# Patient Record
Sex: Female | Born: 1935 | Race: White | Hispanic: No | Marital: Married | Smoking: Never smoker
Health system: Southern US, Community
[De-identification: ages and names within clinical notes are randomized; demographics above are authoritative.]

## PROBLEM LIST (undated history)

## (undated) DIAGNOSIS — I480 Paroxysmal atrial fibrillation: Secondary | ICD-10-CM

## (undated) DIAGNOSIS — I10 Essential (primary) hypertension: Secondary | ICD-10-CM

## (undated) DIAGNOSIS — K2100 Gastro-esophageal reflux disease with esophagitis, without bleeding: Secondary | ICD-10-CM

## (undated) HISTORY — PX: APPENDECTOMY: SHX54

## (undated) HISTORY — PX: CHOLECYSTECTOMY: SHX55

---

## 2016-04-30 ENCOUNTER — Emergency Department: Payer: Medicare Other

## 2016-04-30 ENCOUNTER — Observation Stay (HOSPITAL_BASED_OUTPATIENT_CLINIC_OR_DEPARTMENT_OTHER)
Admit: 2016-04-30 | Discharge: 2016-04-30 | Disposition: A | Discharge status: Home / Self Care | Payer: Medicare Other | Attending: Internal Medicine | Admitting: Internal Medicine

## 2016-04-30 ENCOUNTER — Observation Stay
Admission: EM | Admit: 2016-04-30 | Discharge: 2016-05-01 | Disposition: A | Discharge status: Home / Self Care | Payer: Medicare Other | Attending: Internal Medicine | Admitting: Internal Medicine

## 2016-04-30 ENCOUNTER — Encounter: Payer: Self-pay | Admitting: Radiology

## 2016-04-30 ENCOUNTER — Observation Stay: Payer: Medicare Other

## 2016-04-30 DIAGNOSIS — E039 Hypothyroidism, unspecified: Secondary | ICD-10-CM

## 2016-04-30 DIAGNOSIS — I4891 Unspecified atrial fibrillation: Secondary | ICD-10-CM

## 2016-04-30 DIAGNOSIS — M549 Dorsalgia, unspecified: Secondary | ICD-10-CM

## 2016-04-30 DIAGNOSIS — I350 Nonrheumatic aortic (valve) stenosis: Secondary | ICD-10-CM | POA: Insufficient documentation

## 2016-04-30 DIAGNOSIS — E876 Hypokalemia: Secondary | ICD-10-CM

## 2016-04-30 DIAGNOSIS — Z79899 Other long term (current) drug therapy: Secondary | ICD-10-CM | POA: Insufficient documentation

## 2016-04-30 DIAGNOSIS — Z7982 Long term (current) use of aspirin: Secondary | ICD-10-CM | POA: Insufficient documentation

## 2016-04-30 DIAGNOSIS — R001 Bradycardia, unspecified: Secondary | ICD-10-CM | POA: Diagnosis not present

## 2016-04-30 DIAGNOSIS — M47814 Spondylosis without myelopathy or radiculopathy, thoracic region: Secondary | ICD-10-CM | POA: Insufficient documentation

## 2016-04-30 DIAGNOSIS — R778 Other specified abnormalities of plasma proteins: Secondary | ICD-10-CM | POA: Insufficient documentation

## 2016-04-30 DIAGNOSIS — I10 Essential (primary) hypertension: Secondary | ICD-10-CM

## 2016-04-30 DIAGNOSIS — M546 Pain in thoracic spine: Secondary | ICD-10-CM | POA: Diagnosis not present

## 2016-04-30 DIAGNOSIS — R079 Chest pain, unspecified: Secondary | ICD-10-CM

## 2016-04-30 DIAGNOSIS — I480 Paroxysmal atrial fibrillation: Secondary | ICD-10-CM | POA: Insufficient documentation

## 2016-04-30 DIAGNOSIS — K210 Gastro-esophageal reflux disease with esophagitis: Secondary | ICD-10-CM | POA: Diagnosis not present

## 2016-04-30 DIAGNOSIS — R7989 Other specified abnormal findings of blood chemistry: Secondary | ICD-10-CM | POA: Diagnosis not present

## 2016-04-30 DIAGNOSIS — R0789 Other chest pain: Principal | ICD-10-CM | POA: Insufficient documentation

## 2016-04-30 DIAGNOSIS — K219 Gastro-esophageal reflux disease without esophagitis: Secondary | ICD-10-CM

## 2016-04-30 DIAGNOSIS — D72829 Elevated white blood cell count, unspecified: Secondary | ICD-10-CM | POA: Insufficient documentation

## 2016-04-30 DIAGNOSIS — Z9049 Acquired absence of other specified parts of digestive tract: Secondary | ICD-10-CM | POA: Insufficient documentation

## 2016-04-30 DIAGNOSIS — R748 Abnormal levels of other serum enzymes: Secondary | ICD-10-CM | POA: Diagnosis not present

## 2016-04-30 DIAGNOSIS — G8929 Other chronic pain: Secondary | ICD-10-CM | POA: Diagnosis not present

## 2016-04-30 HISTORY — DX: Essential (primary) hypertension: I10

## 2016-04-30 HISTORY — DX: Gastro-esophageal reflux disease with esophagitis, without bleeding: K21.00

## 2016-04-30 HISTORY — DX: Gastro-esophageal reflux disease with esophagitis: K21.0

## 2016-04-30 HISTORY — DX: Paroxysmal atrial fibrillation: I48.0

## 2016-04-30 LAB — BASIC METABOLIC PANEL
ANION GAP: 11 (ref 5–15)
ANION GAP: 7 (ref 5–15)
BUN: 24 mg/dL — ABNORMAL HIGH (ref 6–20)
BUN: 24 mg/dL — ABNORMAL HIGH (ref 6–20)
CALCIUM: 9.3 mg/dL (ref 8.9–10.3)
CHLORIDE: 106 mmol/L (ref 101–111)
CO2: 24 mmol/L (ref 22–32)
CO2: 27 mmol/L (ref 22–32)
Calcium: 8.9 mg/dL (ref 8.9–10.3)
Chloride: 107 mmol/L (ref 101–111)
Creatinine, Ser: 0.68 mg/dL (ref 0.44–1.00)
Creatinine, Ser: 0.77 mg/dL (ref 0.44–1.00)
GFR calc non Af Amer: >60 mL/min (ref >60)
Glucose, Bld: 166 mg/dL — ABNORMAL HIGH (ref 65–99)
Glucose, Bld: 117 mg/dL — ABNORMAL HIGH (ref 65–99)
POTASSIUM: 3 mmol/L — AB (ref 3.5–5.1)
Potassium: 5 mmol/L (ref 3.5–5.1)
SODIUM: 142 mmol/L (ref 135–145)
Sodium: 140 mmol/L (ref 135–145)

## 2016-04-30 LAB — CBC
HEMATOCRIT: 37.1 % (ref 35.0–47.0)
HEMATOCRIT: 35.8 % (ref 35.0–47.0)
HEMOGLOBIN: 12.7 g/dL (ref 12.0–16.0)
HEMOGLOBIN: 12 g/dL (ref 12.0–16.0)
MCH: 29.9 pg (ref 26.0–34.0)
MCH: 29.8 pg (ref 26.0–34.0)
MCHC: 34.1 g/dL (ref 32.0–36.0)
MCHC: 33.6 g/dL (ref 32.0–36.0)
MCV: 87.7 fL (ref 80.0–100.0)
MCV: 88.7 fL (ref 80.0–100.0)
Platelets: 290 10*3/uL (ref 150–440)
Platelets: 274 10*3/uL (ref 150–440)
RBC: 4.23 MIL/uL (ref 3.80–5.20)
RBC: 4.03 MIL/uL (ref 3.80–5.20)
RDW: 13.7 % (ref 11.5–14.5)
RDW: 14 % (ref 11.5–14.5)
WBC: 11.2 10*3/uL — AB (ref 3.6–11.0)
WBC: 13.3 10*3/uL — AB (ref 3.6–11.0)

## 2016-04-30 LAB — TROPONIN I
Troponin I: 0.12 ng/mL (ref <0.03)
Troponin I: 0.41 ng/mL (ref <0.03)
Troponin I: 0.38 ng/mL (ref <0.03)

## 2016-04-30 LAB — MAGNESIUM: Magnesium: 2 mg/dL (ref 1.7–2.4)

## 2016-04-30 LAB — TSH: TSH: 1.451 u[IU]/mL (ref 0.350–4.500)

## 2016-04-30 MED ORDER — DOCUSATE SODIUM 100 MG PO CAPS
100.0000 mg | ORAL_CAPSULE | Freq: Two times a day (BID) | ORAL | Status: DC
  Administered 2016-04-30: 100 mg via ORAL
  Filled 2016-04-30 (×2): qty 1

## 2016-04-30 MED ORDER — LEVOTHYROXINE SODIUM 88 MCG PO TABS
88.0000 ug | ORAL_TABLET | Freq: Every day | ORAL | Status: DC
  Administered 2016-04-30 – 2016-05-01 (×2): 88 ug via ORAL
  Filled 2016-04-30 (×2): qty 1

## 2016-04-30 MED ORDER — POTASSIUM CHLORIDE 20 MEQ PO PACK
40.0000 meq | PACK | Freq: Once | ORAL | Status: AC
  Administered 2016-04-30: 40 meq via ORAL
  Filled 2016-04-30: qty 2

## 2016-04-30 MED ORDER — SODIUM CHLORIDE 0.9% FLUSH
3.0000 mL | Freq: Two times a day (BID) | INTRAVENOUS | Status: DC
  Administered 2016-04-30 – 2016-05-01 (×3): 3 mL via INTRAVENOUS

## 2016-04-30 MED ORDER — ONDANSETRON HCL 4 MG/2ML IJ SOLN
4.0000 mg | Freq: Once | INTRAMUSCULAR | Status: AC
  Administered 2016-04-30: 4 mg via INTRAVENOUS
  Filled 2016-04-30: qty 2

## 2016-04-30 MED ORDER — AMLODIPINE BESYLATE 10 MG PO TABS
10.0000 mg | ORAL_TABLET | Freq: Every day | ORAL | Status: DC
  Administered 2016-04-30 – 2016-05-01 (×2): 10 mg via ORAL
  Filled 2016-04-30 (×2): qty 1

## 2016-04-30 MED ORDER — ASPIRIN 81 MG PO CHEW
81.0000 mg | CHEWABLE_TABLET | Freq: Every day | ORAL | Status: DC
  Administered 2016-04-30 – 2016-05-01 (×2): 81 mg via ORAL
  Filled 2016-04-30 (×2): qty 1

## 2016-04-30 MED ORDER — ACETAMINOPHEN 325 MG PO TABS: 650.0000 mg | ORAL_TABLET | Freq: Four times a day (QID) | ORAL | Status: DC | PRN

## 2016-04-30 MED ORDER — ENOXAPARIN SODIUM 40 MG/0.4ML ~~LOC~~ SOLN
40.0000 mg | SUBCUTANEOUS | Status: DC
  Administered 2016-04-30 – 2016-05-01 (×2): 40 mg via SUBCUTANEOUS
  Filled 2016-04-30 (×2): qty 0.4

## 2016-04-30 MED ORDER — IOPAMIDOL (ISOVUE-370) INJECTION 76%
100.0000 mL | Freq: Once | INTRAVENOUS | Status: AC | PRN
  Administered 2016-04-30: 100 mL via INTRAVENOUS

## 2016-04-30 MED ORDER — HYDROCHLOROTHIAZIDE 25 MG PO TABS
25.0000 mg | ORAL_TABLET | Freq: Every day | ORAL | Status: DC
  Administered 2016-04-30 – 2016-05-01 (×2): 25 mg via ORAL
  Filled 2016-04-30 (×2): qty 1

## 2016-04-30 MED ORDER — NITROGLYCERIN 0.4 MG SL SUBL: 0.4000 mg | SUBLINGUAL_TABLET | SUBLINGUAL | Status: DC | PRN

## 2016-04-30 MED ORDER — LISINOPRIL-HYDROCHLOROTHIAZIDE 20-25 MG PO TABS: 1.0000 | ORAL_TABLET | Freq: Every day | ORAL | Status: DC

## 2016-04-30 MED ORDER — PANTOPRAZOLE SODIUM 40 MG PO TBEC
40.0000 mg | DELAYED_RELEASE_TABLET | Freq: Every day | ORAL | Status: DC
Start: 2016-04-30 — End: 2016-05-01
  Administered 2016-04-30 – 2016-05-01 (×2): 40 mg via ORAL
  Filled 2016-04-30 (×2): qty 1

## 2016-04-30 MED ORDER — ONDANSETRON HCL 4 MG/2ML IJ SOLN: 4.0000 mg | Freq: Four times a day (QID) | INTRAMUSCULAR | Status: DC | PRN

## 2016-04-30 MED ORDER — LISINOPRIL 20 MG PO TABS
20.0000 mg | ORAL_TABLET | Freq: Every day | ORAL | Status: DC
  Administered 2016-04-30 – 2016-05-01 (×2): 20 mg via ORAL
  Filled 2016-04-30 (×2): qty 1

## 2016-04-30 MED ORDER — ONDANSETRON HCL 4 MG PO TABS: 4.0000 mg | ORAL_TABLET | Freq: Four times a day (QID) | ORAL | Status: DC | PRN

## 2016-04-30 MED ORDER — ACETAMINOPHEN 650 MG RE SUPP: 650.0000 mg | Freq: Four times a day (QID) | RECTAL | Status: DC | PRN

## 2016-04-30 NOTE — Progress Notes (Signed)
*  PRELIMINARY RESULTS* Echocardiogram 2D Echocardiogram has been performed.  Cristela BlueHege, Neil Errickson 04/30/2016, 9:12 AM

## 2016-04-30 NOTE — Progress Notes (Signed)
Elevated troponins,seen by cardiologist,schedule for stress test tomorrow,remains bradycardic

## 2016-04-30 NOTE — ED Provider Notes (Signed)
[facility] Emergency Department Provider Note    First MD Initiated Contact with Patient 04/30/16 0250     (approximate)  I have reviewed the triage vital signs and the nursing notes.   HISTORY  Chief Complaint Chest Pain    HPI Christine James is a 83 y.o. female with history of hypertension presents to the emergency department via EMS with chest and back "pressure"with onset on awakening this morning. Patient does admit to episodes of dyspnea yesterday. Per EMS patient was diaphoretic on their arrival with a heart rate in the 40s. Patient denies any lower external pain swelling. Patient denies any history of DVT or PE. Patient denies any nausea or vomiting. Patient states current pain score is 9 out of 10 predominately in the patient's back. Patient was given aspirin by EMS.   Past Medical History:  Diagnosis Date  . Hypertension     There are no active problems to display for this patient.   No past surgical history on file.  Prior to Admission medications   Not on File    Allergies Codeine and Sulfa antibiotics  No family history on file.  Social History Social History  Substance Use Topics  . Smoking status: Not on file  . Smokeless tobacco: Not on file  . Alcohol use Not on file    Review of Systems Constitutional: No fever/chills Eyes: No visual changes. ENT: No sore throat. Cardiovascular: Positive for chest pain. Respiratory: Denies shortness of breath. Gastrointestinal: No abdominal pain.  No nausea, no vomiting.  No diarrhea.  No constipation. Genitourinary: Negative for dysuria. Musculoskeletal: Positive for upper back pain. Skin: Negative for rash. Neurological: Negative for headaches, focal weakness or numbness.  10-point ROS otherwise negative.  ____________________________________________   PHYSICAL EXAM:  VITAL SIGNS: ED Triage Vitals  Enc Vitals Group     BP 04/30/16 0253 (!) 144/110     Pulse Rate  04/30/16 0253 (!) 47     Resp 04/30/16 0253 12     Temp --      Temp src --      SpO2 04/30/16 0253 96 %     Weight 04/30/16 0255 142 lb (64.4 kg)     Height 04/30/16 0255 5\' 5"  (1.651 m)     Head Circumference --      Peak Flow --      Pain Score 04/30/16 0258 9     Pain Loc --      Pain Edu? --      Excl. in GC? --     Constitutional: Alert and oriented. Apparent discomfort Eyes: Conjunctivae are normal. PERRL. EOMI. Head: Atraumatic. Mouth/Throat: Mucous membranes are moist.  Oropharynx non-erythematous. Neck: No stridor.  No meningeal signs.  No cervical spine tenderness to palpation. Cardiovascular: Normal rate, regular rhythm. Good peripheral circulation. Grossly normal heart sounds. Respiratory: Normal respiratory effort.  No retractions. Lungs CTAB. Gastrointestinal: Soft and nontender. No distention.  Musculoskeletal: No lower extremity tenderness nor edema. No gross deformities of extremities. Neurologic:  Normal speech and language. No gross focal neurologic deficits are appreciated.  Skin:  Skin is warm, dry and intact. No rash noted. Psychiatric: Mood and affect are normal. Speech and behavior are normal.  ____________________________________________   LABS (all labs ordered are listed, but only abnormal results are displayed)  Labs Reviewed  BASIC METABOLIC PANEL - Abnormal; Notable for the following:       Result Value   Potassium 3.0 (*)    Glucose, Bld  166 (*)    BUN 24 (*)    All other components within normal limits  CBC - Abnormal; Notable for the following:    WBC 11.2 (*)    All other components within normal limits  TROPONIN I   ____________________________________________  EKG  ED ECG REPORT I, Wardner N Tavie Haseman, the attending physician, personally viewed and interpreted this ECG.   Date: 04/30/2016  EKG Time: 2:53 AM  Rate: 48  Rhythm: Sinus bradycardia  Axis: Normal  Intervals: Normal  ST&T Change: ST segment depression 1 aVL, V3 and  V4 V5 and V6  ____________________________________________  RADIOLOGY I, Trion N Naaman Curro, personally viewed and evaluated these images (plain radiographs) as part of my medical decision making, as well as reviewing the written report by the radiologist.  Dg Chest Port 1 View  Result Date: 04/30/2016 CLINICAL DATA:  83 year old female with chest pain. EXAM: PORTABLE CHEST 1 VIEW COMPARISON:  None. FINDINGS: There is mild emphysematous changes of the lungs with chronic interstitial coarsening. No focal consolidation, pleural effusion, or pneumothorax. No evidence of vascular congestion or edema. The cardiac silhouette is within normal limits. There is atherosclerotic calcification of the aortic arch. There is osteopenia. No acute fracture. IMPRESSION: No active disease. Electronically Signed   By: Elgie CollardArash  Radparvar M.D.   On: 04/30/2016 03:20    ____________________________________________  Procedures   _   INITIAL IMPRESSION / ASSESSMENT AND PLAN / ED COURSE  Pertinent labs & imaging results that were available during my care of the patient were reviewed by me and considered in my medical decision making (see chart for details).  83 year old female presenting with chest and back pressure social with diaphoresis and sinus bradycardia noted on EKG. Laboratory data revealed a normal troponin. Given history and physical exam concern for possible cardiac etiology for patient's chest/back pain. Patient discussed with Dr. Sheryle Haildiamond for hospital admission for further evaluation for chest/back pain as well as sinus bradycardia.      ____________________________________________  FINAL CLINICAL IMPRESSION(S) / ED DIAGNOSES  Final diagnoses:  Chest pain, unspecified type  Sinus bradycardia     MEDICATIONS GIVEN DURING THIS VISIT:  Medications  iopamidol (ISOVUE-370) 76 % injection 100 mL (100 mLs Intravenous Contrast Given 04/30/16 0353)     NEW OUTPATIENT MEDICATIONS STARTED DURING  THIS VISIT:  New Prescriptions   No medications on file    Modified Medications   No medications on file    Discontinued Medications   No medications on file     Note:  This document was prepared using Dragon voice recognition software and may include unintentional dictation errors.    Darci Currentandolph N Vivianna Piccini, MD 04/30/16 [phone]

## 2016-04-30 NOTE — H&P (Signed)
Christine James is an 83 y.o. female.   Chief Complaint: Chest pain HPI: The patient with past medical history of hypertension and GERD presents to emergency department complaining of chest pain. More specifically, the patient admits to pain in her upper back that seems to radiate through her chest (although notably, she states that she does not have anterior chest pain). She describes the pain as "a Mack truck rolling over her chest". He began at rest and lasted for hours. She denies diaphoresis but admits to shortness of breath and nausea. She denies wanting. This is not the first time she has had shortness of breath but in the past attributed to deconditioning whether it be walking up steps or carrying something heavy. CTA of the chest was obtained to rule out aortic dissection. Initial troponin was negative but due to the patient's severe symptoms emergency department staff called the hospitalist service for admission.  Past Medical History:  Diagnosis Date  . Hypertension     Past Surgical History:  Procedure Laterality Date  . APPENDECTOMY    . CHOLECYSTECTOMY      Family History  Problem Relation Age of Onset  . Heart disease Father    Social History:  has no tobacco, alcohol, and drug history on file.  Allergies:  Allergies  Allergen Reactions  . Codeine Shortness Of Breath  . Sulfa Antibiotics Shortness Of Breath    Prior to Admission medications   Medication Sig Start Date End Date Taking? Authorizing Provider  amLODipine (NORVASC) 10 MG tablet Take 10 mg by mouth daily.   Yes Historical Provider, MD  aspirin 81 MG chewable tablet Chew 81 mg by mouth daily.   Yes Historical Provider, MD  levothyroxine (SYNTHROID, LEVOTHROID) 88 MCG tablet Take 88 mcg by mouth daily before breakfast.   Yes Historical Provider, MD  lisinopril-hydrochlorothiazide (PRINZIDE,ZESTORETIC) 20-25 MG tablet Take 1 tablet by mouth daily.   Yes Historical Provider, MD     Results for orders placed  or performed during the hospital encounter of 04/30/16 (from the past 48 hour(s))  Basic metabolic panel     Status: Abnormal   Collection Time: 04/30/16  2:58 AM  Result Value Ref Range   Sodium 142 135 - 145 mmol/L   Potassium 3.0 (L) 3.5 - 5.1 mmol/L   Chloride 107 101 - 111 mmol/L   CO2 24 22 - 32 mmol/L   Glucose, Bld 166 (H) 65 - 99 mg/dL   BUN 24 (H) 6 - 20 mg/dL   Creatinine, Ser 0.68 0.44 - 1.00 mg/dL   Calcium 8.9 8.9 - 10.3 mg/dL   GFR calc non Af Amer >60 >60 mL/min   GFR calc Af Amer >60 >60 mL/min    Comment: (NOTE) The eGFR has been calculated using the CKD EPI equation. This calculation has not been validated in all clinical situations. eGFR's persistently <60 mL/min signify possible Chronic Kidney Disease.    Anion gap 11 5 - 15  CBC     Status: Abnormal   Collection Time: 04/30/16  2:58 AM  Result Value Ref Range   WBC 11.2 (H) 3.6 - 11.0 K/uL   RBC 4.23 3.80 - 5.20 MIL/uL   Hemoglobin 12.7 12.0 - 16.0 g/dL   HCT 37.1 35.0 - 47.0 %   MCV 87.7 80.0 - 100.0 fL   MCH 29.9 26.0 - 34.0 pg   MCHC 34.1 32.0 - 36.0 g/dL   RDW 13.7 11.5 - 14.5 %   Platelets 290 150 - 440  K/uL  Troponin I     Status: None   Collection Time: 04/30/16  2:58 AM  Result Value Ref Range   Troponin I <0.03 <0.03 ng/mL   Dg Chest Port 1 View  Result Date: 04/30/2016 CLINICAL DATA:  83 year old female with chest pain. EXAM: PORTABLE CHEST 1 VIEW COMPARISON:  None. FINDINGS: There is mild emphysematous changes of the lungs with chronic interstitial coarsening. No focal consolidation, pleural effusion, or pneumothorax. No evidence of vascular congestion or edema. The cardiac silhouette is within normal limits. There is atherosclerotic calcification of the aortic arch. There is osteopenia. No acute fracture. IMPRESSION: No active disease. Electronically Signed   By: Anner Crete M.D.   On: 04/30/2016 03:20   Ct Angio Chest Aorta W And/or Wo Contrast  Result Date: 04/30/2016 CLINICAL DATA:   83 year old female with dental chest pain radiating to the back. EXAM: CT ANGIOGRAPHY CHEST WITH CONTRAST TECHNIQUE: Multidetector CT imaging of the chest was performed using the standard protocol during bolus administration of intravenous contrast. Multiplanar CT image reconstructions and MIPs were obtained to evaluate the vascular anatomy. CONTRAST:  100 cc Isovue 370 COMPARISON:  Chest radiograph dated 04/30/2016 FINDINGS: Cardiovascular: There is mild cardiomegaly with biatrial dilatation. Retrograde flow of contrast from the right atrium into the IVC consistent with a degree of right cardiac dysfunction. Correlation with echocardiogram recommended. There is no pericardial effusion. Moderate atherosclerotic calcification of the thoracic aorta. No aneurysmal dilatation or evidence of dissection. The origins of the great vessels of the aortic arch appear patent. There is no CT evidence of pulmonary embolism. Mediastinum/Nodes: Bilateral hilar and mediastinal lymph adenopathy noted. Right hilar lymph node measures up to 16 mm in short axis. Subcarinal lymphadenopathy measures 16 mm short axis. Right paratracheal lymph nodes measures 19 mm in short axis. There is a small hiatal hernia. The esophagus is grossly unremarkable. Lungs/Pleura: Faint small scattered nodular densities in the left upper lobe measuring up to 3-4 mm may be chronic or likely inflammatory/infectious in etiology. A confluent small nodular densities noted along the right major fissure (series 7, image 25), likely inflammatory/ infectious. An area of linear and streaky density in the lingula may represent atelectasis versus scarring. Pneumonia is not excluded. There is no pleural effusion or pneumothorax. Upper Abdomen: The visualized upper abdomen is unremarkable. Musculoskeletal: Mild degenerative changes of the spine. No acute fracture. Review of the MIP images confirms the above findings. IMPRESSION: 1. No CT evidence of pulmonary embolism. 2.  Mild cardiomegaly with findings suggestive of right cardiac dysfunction. Correlation with echocardiogram recommended. 3. Scattered small nodular densities in the left upper lobe and along the right major fissure which may be chronic or represent an infectious/inflammatory process. Focal linear and streaky density in the lingula anteriorly likely atelectasis/scarring. Clinical correlation follow-up recommended. 4. Bilateral hilar and mediastinal adenopathy of indeterminate etiology. Although findings may be reactive, metastatic disease or lymphoma is not entirely excluded. Correlation with clinical exam and follow-up recommended. Electronically Signed   By: Anner Crete M.D.   On: 04/30/2016 04:22    Review of Systems  Constitutional: Negative for chills and fever.  HENT: Negative for sore throat and tinnitus.   Eyes: Negative for blurred vision and redness.  Respiratory: Positive for shortness of breath (acute on chronic). Negative for cough.   Cardiovascular: Positive for chest pain (posterior). Negative for palpitations, orthopnea and PND.  Gastrointestinal: Positive for nausea. Negative for abdominal pain, diarrhea and vomiting.  Genitourinary: Negative for dysuria, frequency and urgency.  Musculoskeletal:  Negative for joint pain and myalgias.  Skin: Negative for rash.       No lesions  Neurological: Negative for speech change, focal weakness and weakness.  Endo/Heme/Allergies: Does not bruise/bleed easily.       No temperature intolerance  Psychiatric/Behavioral: Negative for depression and suicidal ideas.    Blood pressure (!) 125/51, pulse (!) 54, resp. rate 19, height 5' 5"  (1.651 m), weight 64.4 kg (142 lb), SpO2 96 %. Physical Exam  Vitals reviewed. Constitutional: She is oriented to person, place, and time. She appears well-developed and well-nourished. No distress.  HENT:  Head: Normocephalic and atraumatic.  Mouth/Throat: Oropharynx is clear and moist.  Eyes: Conjunctivae  and EOM are normal. Pupils are equal, round, and reactive to light. No scleral icterus.  Neck: Normal range of motion. Neck supple. No JVD present. No tracheal deviation present. No thyromegaly present.  Cardiovascular: Normal rate, regular rhythm and normal heart sounds.  Exam reveals no gallop and no friction rub.   No murmur heard. Respiratory: Effort normal and breath sounds normal.  GI: Soft. Bowel sounds are normal. She exhibits no distension. There is no tenderness.  Genitourinary:  Genitourinary Comments: Deferred  Musculoskeletal: Normal range of motion. She exhibits edema (trace).  Lymphadenopathy:    She has no cervical adenopathy.  Neurological: She is alert and oriented to person, place, and time. No cranial nerve deficit. She exhibits normal muscle tone.  Skin: Skin is warm and dry. No rash noted. No erythema.  Psychiatric: She has a normal mood and affect. Her behavior is normal. Judgment and thought content normal.     Assessment/Plan This is an 83 year old female admitted for atypical chest pain. 1. Chest pain: Atypical in duration and location. The patient reports history of severe reflux. Her symptoms could be consistent with the same. No dissection seen on CT scan. The patient does have some chronic persistent changes in her lungs including a potentially nodular area in the right lower lung. There is also some fluid in the right fissure. White blood cell count is barely elevated above lab normals although her presentation is not consistent with pneumonia or pulmonary edema. Continue cycle cardiac enzymes. Monitor telemetry. Echocardiogram ordered. Consult cardiology. Continue aspirin 2. Hypertension: Controlled; continue amlodipine, lisinopril and hydrochlorothiazide 3. Hypothyroidism: Continue Synthroid. Check TSH. 4. DVT prophylaxis: Lovenox 5. GI prophylaxis: PPI for reflux symptoms The patient is a full code. Time spent on admission orders and patient care approximately  45 minutes  Harrie Foreman, MD 04/30/2016, 6:47 AM

## 2016-04-30 NOTE — Progress Notes (Signed)
Notified of elevated troponin 0.41. Has remained symptom-free. Presenting symptoms atypical (back pressure that is reproducible to palpation). If she remains symptom-free can hold off on heparin gtt at this time. Echo shows LVH with AS. Elevated troponin possibly in the setting of Afib with LVH and AS. [facility]

## 2016-04-30 NOTE — Progress Notes (Signed)
[employer] [facility] [place]   PATIENT NAME: Christine James    MR#:  [id number]  DATE OF BIRTH:  [birth year]  SUBJECTIVE:   Pt. Here due to back pain/atypical chest pain.  Pain free now.  No other complaints presently. Son at bedside.   REVIEW OF SYSTEMS:    Review of Systems  Constitutional: Negative for chills and fever.  HENT: Negative for congestion and tinnitus.   Eyes: Negative for blurred vision and double vision.  Respiratory: Negative for cough, shortness of breath and wheezing.   Cardiovascular: Negative for chest pain, orthopnea and PND.  Gastrointestinal: Negative for abdominal pain, diarrhea, nausea and vomiting.  Genitourinary: Negative for dysuria and hematuria.  Neurological: Negative for dizziness, sensory change and focal weakness.  All other systems reviewed and are negative.   Nutrition: Heart Healthy Tolerating Diet: Yes Tolerating PT: Ambulatory    DRUG ALLERGIES:   Allergies  Allergen Reactions  . Codeine Shortness Of Breath  . Sulfa Antibiotics Shortness Of Breath    VITALS:  Blood pressure (!) 128/40, pulse (!) 57, temperature 98.2 F (36.8 C), temperature source Oral, resp. rate 20, height 5\' 2"  (1.575 m), weight 63.7 kg (140 lb 6.4 oz), SpO2 93 %.  PHYSICAL EXAMINATION:   Physical Exam  GENERAL:  83 y.o.-year-old patient lying in the bed with no acute distress.  EYES: Pupils equal, round, reactive to light and accommodation. No scleral icterus. Extraocular muscles intact.  HEENT: Head atraumatic, normocephalic. Oropharynx and nasopharynx clear.  NECK:  Supple, no jugular venous distention. No thyroid enlargement, no tenderness.  LUNGS: Normal breath sounds bilaterally, no wheezing, rales, rhonchi. No use of accessory muscles of respiration.  CARDIOVASCULAR: S1, S2 normal.  II/VI SEM at LSB, No rubs, or gallops.  ABDOMEN: Soft, nontender, nondistended. Bowel sounds present. No organomegaly or mass.  EXTREMITIES: No  cyanosis, clubbing or edema b/l.    NEUROLOGIC: Cranial nerves II through XII are intact. No focal Motor or sensory deficits b/l.   PSYCHIATRIC: The patient is alert and oriented x 3.  SKIN: No obvious rash, lesion, or ulcer.    LABORATORY PANEL:   CBC  Recent Labs Lab 04/30/16 0743  WBC 13.3*  HGB 12.0  HCT 35.8  PLT 274   ------------------------------------------------------------------------------------------------------------------  Chemistries   Recent Labs Lab 04/30/16 0743 04/30/16 1257  NA 140  --   K 5.0  --   CL 106  --   CO2 27  --   GLUCOSE 117*  --   BUN 24*  --   CREATININE 0.77  --   CALCIUM 9.3  --   MG  --  2.0   ------------------------------------------------------------------------------------------------------------------  Cardiac Enzymes  Recent Labs Lab 04/30/16 1257  TROPONINI 0.41*   ------------------------------------------------------------------------------------------------------------------  RADIOLOGY:  Dg Thoracic Spine 2 View  Result Date: 04/30/2016 CLINICAL DATA:  Back pain since early this morning. No acute injury. EXAM: THORACIC SPINE 2 VIEWS COMPARISON:  CT scan of the chest dated 04/30/2016 FINDINGS: There is slight degenerative disc disease at T7-8 with a vacuum phenomenon anteriorly. There is no fracture or subluxation. Incidental note is made of aortic atherosclerosis. IMPRESSION: Slight degenerative disc disease at T7-8. Aortic atherosclerosis. Electronically Signed   By: Francene Boyers M.D.   On: 04/30/2016 13:34   Dg Chest Port 1 View  Result Date: 04/30/2016 CLINICAL DATA:  83 year old female with chest pain. EXAM: PORTABLE CHEST 1 VIEW COMPARISON:  None. FINDINGS: There is mild emphysematous changes of the lungs with chronic interstitial  coarsening. No focal consolidation, pleural effusion, or pneumothorax. No evidence of vascular congestion or edema. The cardiac silhouette is within normal limits. There is  atherosclerotic calcification of the aortic arch. There is osteopenia. No acute fracture. IMPRESSION: No active disease. Electronically Signed   By: Elgie Collard M.D.   On: 04/30/2016 03:20   Ct Angio Chest Aorta W And/or Wo Contrast  Result Date: 04/30/2016 CLINICAL DATA:  83 year old female with dental chest pain radiating to the back. EXAM: CT ANGIOGRAPHY CHEST WITH CONTRAST TECHNIQUE: Multidetector CT imaging of the chest was performed using the standard protocol during bolus administration of intravenous contrast. Multiplanar CT image reconstructions and MIPs were obtained to evaluate the vascular anatomy. CONTRAST:  100 cc Isovue 370 COMPARISON:  Chest radiograph dated 04/30/2016 FINDINGS: Cardiovascular: There is mild cardiomegaly with biatrial dilatation. Retrograde flow of contrast from the right atrium into the IVC consistent with a degree of right cardiac dysfunction. Correlation with echocardiogram recommended. There is no pericardial effusion. Moderate atherosclerotic calcification of the thoracic aorta. No aneurysmal dilatation or evidence of dissection. The origins of the great vessels of the aortic arch appear patent. There is no CT evidence of pulmonary embolism. Mediastinum/Nodes: Bilateral hilar and mediastinal lymph adenopathy noted. Right hilar lymph node measures up to 16 mm in short axis. Subcarinal lymphadenopathy measures 16 mm short axis. Right paratracheal lymph nodes measures 19 mm in short axis. There is a small hiatal hernia. The esophagus is grossly unremarkable. Lungs/Pleura: Faint small scattered nodular densities in the left upper lobe measuring up to 3-4 mm may be chronic or likely inflammatory/infectious in etiology. A confluent small nodular densities noted along the right major fissure (series 7, image 25), likely inflammatory/ infectious. An area of linear and streaky density in the lingula may represent atelectasis versus scarring. Pneumonia is not excluded. There is  no pleural effusion or pneumothorax. Upper Abdomen: The visualized upper abdomen is unremarkable. Musculoskeletal: Mild degenerative changes of the spine. No acute fracture. Review of the MIP images confirms the above findings. IMPRESSION: 1. No CT evidence of pulmonary embolism. 2. Mild cardiomegaly with findings suggestive of right cardiac dysfunction. Correlation with echocardiogram recommended. 3. Scattered small nodular densities in the left upper lobe and along the right major fissure which may be chronic or represent an infectious/inflammatory process. Focal linear and streaky density in the lingula anteriorly likely atelectasis/scarring. Clinical correlation follow-up recommended. 4. Bilateral hilar and mediastinal adenopathy of indeterminate etiology. Although findings may be reactive, metastatic disease or lymphoma is not entirely excluded. Correlation with clinical exam and follow-up recommended. Electronically Signed   By: Elgie Collard M.D.   On: 04/30/2016 04:22     ASSESSMENT AND PLAN:   83 year old female with past medical history of hypertension, as well as atrial fibrillation, GERD who presented to the hospital due to back pain/atypical chest pain.  1. Atypical chest pain-patient currently is chest pain-free and hemodynamically stable.  -her troponins are mildly trending upwards. -Plan for stress test tomorrow.  - clinically stable. CT angio was (-) for Aortic dissection.   2. Back Pain - due to DJD.  - Xray of thoracic spine showing DJD but no acute pathology.   3. Essential hypertension-continue lisinopril/HCTZ, Norvasc.  4. Paroxysmal atrial fibrillation-currently in normal sinus rhythm and rate controlled. -As per cards she may benefit from long-term coagulation. Plan to start Eliquis upon discharge.    All the records are reviewed and case discussed with Care Management/Social Worker. Management plans discussed with the patient, family and they  are in  agreement.  CODE STATUS: Full Code  DVT Prophylaxis: Lovenox  TOTAL TIME TAKING CARE OF THIS PATIENT: 30 minutes.   POSSIBLE D/C IN 1-2 DAYS, DEPENDING ON CLINICAL CONDITION.   [place] SirenSAINANI,Hussam Muniz J M.D on 04/30/2016 at 4:09 PM  Between 7am to 6pm - Pager - [phone]  After 6pm go to [email or web address] - Scientist, research (life sciences)password [email or web address]  [facility]  Office  [phone]  CC: Primary care physician; No PCP Per Patient

## 2016-04-30 NOTE — Consult Note (Signed)
Cardiology Consultation Note  Patient ID: Christine James, MRN: [id number], DOB/AGE: Jan 22, 1929 83 y.o. Admit date: 04/30/2016   Date of Consult: 04/30/2016 Primary Physician: No PCP Per Patient Primary Cardiologist: New to [facility] - consult by [place] Requesting Physician: Dr. Sheryle Hail, MD  Chief Complaint: Back pain Reason for Consult: Same/noted to be in new onset Afib upon cardiology review of telemetry   HPI: 83 y.o. female with h/o HTN and reflux who presented to [facility] overnight with thoracic back lasting approximately 2-3 hours that self resolved. Cardiology is consulted for back pain. Upon cardiology seeing the patient she was noted to have had a short run of new onset Afib, rate controlled.   She does not have any previously known cardiac history. Patient notes approximately 25 pounds weight loss over the past 12 months, not intentional. She has a long history of reflux that is usually worse at night. She was in her usual state of health until the middle of the night on 3/14 when she developed thoracic back pain prompting her to come to the ED for evaluation. Never with chest pain, SOB, palpitations, nausea, vomiting, dizziness, presyncope, or syncope. No recent injury or trauma. Pain felt similar to her prior reflux, though persisted longer. She denies any decrease in functional capacity and has never had any exertional symptoms. She sleeps with one pillow. No LE swelling. No orthopnea.   Upon the patient's arrival to [facility] they were found to have blood pressure 144/110, HR 47 bpm, oxygen saturation 96% on room air. Initial troponin negative. Potassium low at 3.0. WBC 11.2-->13.3 at time of cardiology consult. Subsequent troponin and potassium pending.  ECG as below, CXR showed no acute process. CTA chest was negative for PE and aortic dissection. Incidental finding of RV systolic dysfunction. This morning patient is asymptomatic. Review of telemetry showed a short run of new onset Afib, rate  controlled from 4:45 AM to 5:45 AM along with frequent PACs/PVCs. Echo pending.   Past Medical History:  Diagnosis Date  . Hypertension   . PAF (paroxysmal atrial fibrillation) (HCC)    a. diagnosed 04/30/16; b. CHADS2VASc 4 (HTN, age x 2, female)  . Reflux esophagitis       Most Recent Cardiac Studies: TTE pending   Surgical History:  Past Surgical History:  Procedure Laterality Date  . APPENDECTOMY    . CHOLECYSTECTOMY       Home Meds: Prior to Admission medications   Medication Sig Start Date End Date Taking? Authorizing Provider  amLODipine (NORVASC) 10 MG tablet Take 10 mg by mouth daily.   Yes Historical Provider, MD  aspirin 81 MG chewable tablet Chew 81 mg by mouth daily.   Yes Historical Provider, MD  levothyroxine (SYNTHROID, LEVOTHROID) 88 MCG tablet Take 88 mcg by mouth daily before breakfast.   Yes Historical Provider, MD  lisinopril-hydrochlorothiazide (PRINZIDE,ZESTORETIC) 20-25 MG tablet Take 1 tablet by mouth daily.   Yes Historical Provider, MD    Inpatient Medications:  . amLODipine  10 mg Oral Daily  . aspirin  81 mg Oral Daily  . docusate sodium  100 mg Oral BID  . enoxaparin (LOVENOX) injection  40 mg Subcutaneous Q24H  . lisinopril  20 mg Oral Daily   And  . hydrochlorothiazide  25 mg Oral Daily  . levothyroxine  88 mcg Oral QAC breakfast  . pantoprazole  40 mg Oral Daily  . sodium chloride flush  3 mL Intravenous Q12H     Allergies:  Allergies  Allergen Reactions  .  Codeine Shortness Of Breath  . Sulfa Antibiotics Shortness Of Breath    Social History   Social History  . Marital status: Married    Spouse name: N/A  . Number of children: N/A  . Years of education: N/A   Occupational History  . Not on file.   Social History Main Topics  . Smoking status: Never Smoker  . Smokeless tobacco: Never Used  . Alcohol use Not on file  . Drug use: Unknown  . Sexual activity: Not on file   Other Topics Concern  . Not on file   Social  History Narrative  . No narrative on file     Family History  Problem Relation Age of Onset  . Heart disease Father      Review of Systems: Review of Systems  Constitutional: Positive for weight loss. Negative for chills, diaphoresis, fever and malaise/fatigue.  HENT: Negative for congestion.   Eyes: Negative for discharge and redness.  Respiratory: Negative for cough, hemoptysis, sputum production, shortness of breath and wheezing.   Cardiovascular: Negative for chest pain, palpitations, orthopnea, claudication, leg swelling and PND.  Gastrointestinal: Positive for heartburn. Negative for abdominal pain, blood in stool, melena, nausea and vomiting.  Genitourinary: Negative for hematuria.  Musculoskeletal: Positive for back pain. Negative for falls and myalgias.  Skin: Negative for rash.  Neurological: Negative for dizziness, tingling, tremors, sensory change, speech change, focal weakness, loss of consciousness and weakness.  Endo/Heme/Allergies: Does not bruise/bleed easily.  Psychiatric/Behavioral: Negative for substance abuse. The patient is not nervous/anxious.   All other systems reviewed and are negative.   Labs:  Recent Labs  04/30/16 0258  TROPONINI <0.03   Lab Results  Component Value Date   WBC 11.2 (H) 04/30/2016   HGB 12.7 04/30/2016   HCT 37.1 04/30/2016   MCV 87.7 04/30/2016   PLT 290 04/30/2016     Recent Labs Lab 04/30/16 0258  NA 142  K 3.0*  CL 107  CO2 24  BUN 24*  CREATININE 0.68  CALCIUM 8.9  GLUCOSE 166*   No results found for: CHOL, HDL, LDLCALC, TRIG No results found for: DDIMER  Radiology/Studies:  Dg Chest Port 1 View  Result Date: 04/30/2016 CLINICAL DATA:  83 year old female with chest pain. EXAM: PORTABLE CHEST 1 VIEW COMPARISON:  None. FINDINGS: There is mild emphysematous changes of the lungs with chronic interstitial coarsening. No focal consolidation, pleural effusion, or pneumothorax. No evidence of vascular congestion or  edema. The cardiac silhouette is within normal limits. There is atherosclerotic calcification of the aortic arch. There is osteopenia. No acute fracture. IMPRESSION: No active disease. Electronically Signed   By: Elgie Collard M.D.   On: 04/30/2016 03:20   Ct Angio Chest Aorta W And/or Wo Contrast  Result Date: 04/30/2016 CLINICAL DATA:  83 year old female with dental chest pain radiating to the back. EXAM: CT ANGIOGRAPHY CHEST WITH CONTRAST TECHNIQUE: Multidetector CT imaging of the chest was performed using the standard protocol during bolus administration of intravenous contrast. Multiplanar CT image reconstructions and MIPs were obtained to evaluate the vascular anatomy. CONTRAST:  100 cc Isovue 370 COMPARISON:  Chest radiograph dated 04/30/2016 FINDINGS: Cardiovascular: There is mild cardiomegaly with biatrial dilatation. Retrograde flow of contrast from the right atrium into the IVC consistent with a degree of right cardiac dysfunction. Correlation with echocardiogram recommended. There is no pericardial effusion. Moderate atherosclerotic calcification of the thoracic aorta. No aneurysmal dilatation or evidence of dissection. The origins of the great vessels of the aortic arch  appear patent. There is no CT evidence of pulmonary embolism. Mediastinum/Nodes: Bilateral hilar and mediastinal lymph adenopathy noted. Right hilar lymph node measures up to 16 mm in short axis. Subcarinal lymphadenopathy measures 16 mm short axis. Right paratracheal lymph nodes measures 19 mm in short axis. There is a small hiatal hernia. The esophagus is grossly unremarkable. Lungs/Pleura: Faint small scattered nodular densities in the left upper lobe measuring up to 3-4 mm may be chronic or likely inflammatory/infectious in etiology. A confluent small nodular densities noted along the right major fissure (series 7, image 25), likely inflammatory/ infectious. An area of linear and streaky density in the lingula may represent  atelectasis versus scarring. Pneumonia is not excluded. There is no pleural effusion or pneumothorax. Upper Abdomen: The visualized upper abdomen is unremarkable. Musculoskeletal: Mild degenerative changes of the spine. No acute fracture. Review of the MIP images confirms the above findings. IMPRESSION: 1. No CT evidence of pulmonary embolism. 2. Mild cardiomegaly with findings suggestive of right cardiac dysfunction. Correlation with echocardiogram recommended. 3. Scattered small nodular densities in the left upper lobe and along the right major fissure which may be chronic or represent an infectious/inflammatory process. Focal linear and streaky density in the lingula anteriorly likely atelectasis/scarring. Clinical correlation follow-up recommended. 4. Bilateral hilar and mediastinal adenopathy of indeterminate etiology. Although findings may be reactive, metastatic disease or lymphoma is not entirely excluded. Correlation with clinical exam and follow-up recommended. Electronically Signed   By: Elgie Collard M.D.   On: 04/30/2016 04:22    EKG: Interpreted by me showed: sinus bradycardia, 48 bpm, right axis deviation, rare PAC, possible prior anteroseptal infarct, inferolateral T-wave inversion Telemetry: Interpreted by me showed: sinus rhythm, currently with heart rate in the 60's bpm, episode of rate-controlled Afib at 4:45 AM to 5:45 AM, frequent PACs and PVCs  Weights: Filed Weights   04/30/16 0255 04/30/16 0655  Weight: 142 lb (64.4 kg) 140 lb 6.4 oz (63.7 kg)     Physical Exam: Blood pressure (!) 130/44, pulse (!) 58, temperature 97.9 F (36.6 C), temperature source Oral, resp. rate 19, height 5\' 2"  (1.575 m), weight 140 lb 6.4 oz (63.7 kg), SpO2 98 %. Body mass index is 25.68 kg/m. General: Well developed, well nourished, in no acute distress. Head: Normocephalic, atraumatic, sclera non-icteric, no xanthomas, nares are without discharge.  Neck: Negative for carotid bruits. JVD not  elevated. Lungs: Clear bilaterally to auscultation without wheezes, rales, or rhonchi. Breathing is unlabored. Heart: RRR with S1 S2. III/VI systolic murmur, no rubs, or gallops appreciated. Abdomen: Soft, non-tender, non-distended with normoactive bowel sounds. No hepatomegaly. No rebound/guarding. No obvious abdominal masses. Msk:  Strength and tone appear normal for age. Patient is tender to palpation along the thoracic spine and paraspinal muscles. Extremities: No clubbing or cyanosis. No edema. Distal pedal pulses are 2+ and equal bilaterally. Neuro: Alert and oriented X 3. No facial asymmetry. No focal deficit. Moves all extremities spontaneously. Psych:  Responds to questions appropriately with a normal affect.    Assessment and Plan:  Principal Problem:   Back pain Active Problems:   Acid reflux   Hypokalemia   Leukocytosis   Hypothyroid   Essential hypertension   New onset atrial fibrillation (HCC)    1. Back pain: -Patient never with chest pain or shortness of breath -Initial troponin negative continue to cycle to rule out -CTA chest negative for aortic dissection -CT scan showed possible RV systolic dysfunction, will check echocardiogram to further evaluate -Patient tender to palpation along  the thoracic spine and paraspinal muscles consider MSK etiology -Also noted to be hypokalemic with potassium 3.0 recommend repletion to goal 4.0  2. Hypertension: -Well-controlled continue current medications  3. GERD: -Patient feels like the symptoms could be related to her reflux  4. Hypothyroidism: -TSH pending, continue replacement there  5. Leukocytosis: -Trend CBC -No obvious source of infection  -Consider urinalysis  6. New onset Afib: -Started at 4:45 AM, converted to sinus rhythm at 5:45 AM -Currently in sinus rhythm -Possibly in the setting of hypokalemia  -Check echo as above -CHADS2VASc at least 4 (HTN, age x 2, female) -Will need full-dose anticoagulation   -Could evaluate Afib burden with outpatient monitor   7. Hypokalemia: -Replete to goal 4.0 -Check bmet -Check magnesium    Signed, Eula ListenRyan Elvin Banker, PA-C [facility] Pager: [phone] 04/30/2016, 8:34 AM

## 2016-04-30 NOTE — Progress Notes (Signed)
    Went back up to speak with patient and patient's husband as well as patient's daughter who is a pediatric nurse practitioner this afternoon. Patient's family wanted some clarification on evaluation and treatment process. Discussed with patient and family members in detail regarding patient's echocardiogram findings, brief episode of A. fib earlier this morning, elevated troponin, possible etiologies of patient's symptoms and abnormal objective findings to date, as well as treatment options available. Patient and patient's family have decided upon holding off on heparin at this time unless troponin this evening trends up to at least 0.6. If troponin trends up to 0.6 cardiology recommends initiation of heparin drip by overnight service and continuing to cycle troponin until peaks and downward trends. Also discussed with patient and family member treatment with heparin given her documented A. fib on telemetry and stroke risk. Patient and family member would like to start oral anticoagulation prior to discharge though hold off on heparin drip at this time. They are aware of increased stroke risk. At the end of our conversation the patient did report a mild back pressure without associated symptoms that was not as severe as the symptoms that brought her to the hospital. I offered her a sublingual nitroglycerin however she declined and reported she feels like she is just worked up from all of the above and wanted approximately 15 minutes to see if that will self resolve. I did call the floor and order a stat 12-lead EKG and told the patient to call her nurse if she in fact did want the nitroglycerin.  Of note, patient's daughter would prefer that if the patient needs a cardiac catheterization [facility] as the daughter would like for surgical backup to be present. I discussed with patient and family at this time there is no medical indication for patient  transfer as [facility]. I discussed with them potential avenues to have heart catheterization at outside facility in the setting of normal stress test as well as if stress test is abnormal. However, I did not state transfer could be able to be obtained in any of the above scenarios. Patient's daughter did ask if the patient could be discharged this evening with outpatient cardiology follow-up. I did report to patient and patient's daughter cardiology could not safely advise medical discharge at this time given all of the above. Patient and patient's daughter were okay with this.  At this time, the plan is to hold off on heparin as above and less troponin trends to greater than 0.6 or if patient has recurrent A. fib on telemetry. She will proceed with Lexiscan Myoview stress testing on 05/01/16 to evaluate for high-risk ischemia. Risks and benefits of stress testing were explained to patient and family in detail and all questions/concerns were answered and listened to.  In total our conversation was a little over 30 minutes in duration. All questions and concerns were listened to and answered with satisfactory results.

## 2016-04-30 NOTE — Care Management Obs Status (Signed)
MEDICARE OBSERVATION STATUS NOTIFICATION   Patient Details  Name: Orvilla Fuslice Prokop MRN: [id number] Date of Birth: 12-Jul-1928   Medicare Observation Status Notification Given:  Yes    Eber HongGreene, Sunaina Ferrando R, RN 04/30/2016, 2:13 PM

## 2016-04-30 NOTE — ED Triage Notes (Signed)
Patient coming from home via [facility] with a dull chest pain that radiates to the back that woke her up from her sleep. EMS reports that her initial heart rate was in the 40's. EMS also reports patient was diaphoretic. Patient has hx of thyroid problems, HTN. Patient voices having SHOB earlier in the day but not when the discomfort was present.

## 2016-05-01 ENCOUNTER — Observation Stay (HOSPITAL_BASED_OUTPATIENT_CLINIC_OR_DEPARTMENT_OTHER): Payer: Medicare Other

## 2016-05-01 ENCOUNTER — Encounter: Payer: Self-pay | Admitting: *Deleted

## 2016-05-01 DIAGNOSIS — G8929 Other chronic pain: Secondary | ICD-10-CM

## 2016-05-01 DIAGNOSIS — I4891 Unspecified atrial fibrillation: Secondary | ICD-10-CM

## 2016-05-01 DIAGNOSIS — M546 Pain in thoracic spine: Secondary | ICD-10-CM

## 2016-05-01 DIAGNOSIS — R748 Abnormal levels of other serum enzymes: Secondary | ICD-10-CM

## 2016-05-01 DIAGNOSIS — R0789 Other chest pain: Secondary | ICD-10-CM | POA: Diagnosis not present

## 2016-05-01 DIAGNOSIS — I10 Essential (primary) hypertension: Secondary | ICD-10-CM | POA: Diagnosis not present

## 2016-05-01 LAB — NM MYOCAR MULTI W/SPECT W/WALL MOTION / EF
LV dias vol: 72 mL (ref 46–106)
LV sys vol: 19 mL
Peak HR: 63 {beats}/min
Percent HR: 47 %
Rest HR: 56 {beats}/min
SDS: 2
SRS: 7
SSS: 6
TID: 1.05

## 2016-05-01 LAB — CBC
HEMATOCRIT: 36.7 % (ref 35.0–47.0)
Hemoglobin: 12.5 g/dL (ref 12.0–16.0)
MCH: 30.5 pg (ref 26.0–34.0)
MCHC: 34.1 g/dL (ref 32.0–36.0)
MCV: 89.4 fL (ref 80.0–100.0)
Platelets: 263 10*3/uL (ref 150–440)
RBC: 4.11 MIL/uL (ref 3.80–5.20)
RDW: 14 % (ref 11.5–14.5)
WBC: 8.1 10*3/uL (ref 3.6–11.0)

## 2016-05-01 LAB — BASIC METABOLIC PANEL
Anion gap: 8 (ref 5–15)
BUN: 23 mg/dL — ABNORMAL HIGH (ref 6–20)
CO2: 27 mmol/L (ref 22–32)
Calcium: 8.8 mg/dL — ABNORMAL LOW (ref 8.9–10.3)
Chloride: 104 mmol/L (ref 101–111)
Creatinine, Ser: 0.85 mg/dL (ref 0.44–1.00)
GFR calc Af Amer: >60 mL/min (ref >60)
GFR calc non Af Amer: >60 mL/min (ref >60)
Glucose, Bld: 113 mg/dL — ABNORMAL HIGH (ref 65–99)
Potassium: 4.1 mmol/L (ref 3.5–5.1)
Sodium: 139 mmol/L (ref 135–145)

## 2016-05-01 LAB — HEMOGLOBIN A1C
Hgb A1c MFr Bld: 5.3 % (ref 4.8–5.6)
Mean Plasma Glucose: 105 mg/dL

## 2016-05-01 MED ORDER — TECHNETIUM TC 99M TETROFOSMIN IV KIT
10.0000 | PACK | Freq: Once | INTRAVENOUS | Status: AC | PRN
  Administered 2016-05-01: 11.74 via INTRAVENOUS

## 2016-05-01 MED ORDER — APIXABAN 5 MG PO TABS: 5.0000 mg | ORAL_TABLET | Freq: Two times a day (BID) | ORAL | 0 refills | Status: AC

## 2016-05-01 MED ORDER — APIXABAN 5 MG PO TABS
5.0000 mg | ORAL_TABLET | Freq: Two times a day (BID) | ORAL | Status: DC
  Administered 2016-05-01: 5 mg via ORAL
  Filled 2016-05-01: qty 1

## 2016-05-01 MED ORDER — APIXABAN 5 MG PO TABS: 5.0000 mg | ORAL_TABLET | Freq: Two times a day (BID) | ORAL | Status: DC

## 2016-05-01 MED ORDER — REGADENOSON 0.4 MG/5ML IV SOLN
0.4000 mg | Freq: Once | INTRAVENOUS | Status: AC
  Administered 2016-05-01: 0.4 mg via INTRAVENOUS

## 2016-05-01 MED ORDER — TECHNETIUM TC 99M TETROFOSMIN IV KIT
30.0000 | PACK | Freq: Once | INTRAVENOUS | Status: AC | PRN
  Administered 2016-05-01: 32.92 via INTRAVENOUS

## 2016-05-01 NOTE — Plan of Care (Signed)
Problem: Pain Managment: Goal: General experience of comfort will improve Outcome: Progressing Pt had no complaints of pain this shift.  Problem: Cardiac: Goal: Ability to achieve and maintain adequate cardiovascular perfusion will improve Outcome: Progressing Pt's 4th troponin trended down to 0.38

## 2016-05-01 NOTE — Progress Notes (Signed)
Stress test result Shows no significant ischemia, normal ejection fraction, no significant wall motion abnormality Small region of fixed perfusion defect in the basal inferior wall Unable to exclude small region of prior MI versus attenuation artifact. There is GI uptake artifact noted in this region as well  Overall low risk scan  Signed, Dossie Arbourim Jeanie Mccard, MD, Ph.D [facility]

## 2016-05-01 NOTE — Progress Notes (Signed)
Patient Name: Christine James Date of Encounter: 05/01/2016  Primary Cardiologist: New to [facility] - consult by [facility] Problem List     Principal Problem:   Back pain Active Problems:   Acid reflux   Hypokalemia   Leukocytosis   Hypothyroid   Essential hypertension   New onset atrial fibrillation (HCC)   Chest pain   Elevated troponin     Subjective   No further back pressure. The back pressure she had in the evening of 3/14 lasted approximately 2 hours and self resolved. No medication needed. Troponin trending down from apeak of 0.41 to 0.38 in the evening of 3/14. Echo with normal EF and hypokinesis of basal inferoposterior region. She is for Lexiscan this morning.   Inpatient Medications    Scheduled Meds: . amLODipine  10 mg Oral Daily  . aspirin  81 mg Oral Daily  . docusate sodium  100 mg Oral BID  . enoxaparin (LOVENOX) injection  40 mg Subcutaneous Q24H  . lisinopril  20 mg Oral Daily   And  . hydrochlorothiazide  25 mg Oral Daily  . levothyroxine  88 mcg Oral QAC breakfast  . pantoprazole  40 mg Oral Daily  . sodium chloride flush  3 mL Intravenous Q12H   Continuous Infusions:  PRN Meds: acetaminophen **OR** acetaminophen, nitroGLYCERIN, ondansetron **OR** ondansetron (ZOFRAN) IV   Vital Signs    Vitals:   04/30/16 0655 04/30/16 1115 04/30/16 2036 05/01/16 0435  BP: (!) 130/44 (!) 128/40 (!) 126/44 (!) 135/48  Pulse: (!) 58 (!) 57 (!) 57 (!) 52  Resp:  20 19 18   Temp: 97.9 F (36.6 C) 98.2 F (36.8 C) 98.6 F (37 C) 98 F (36.7 C)  TempSrc: Oral Oral Oral Oral  SpO2: 98% 93% 94% 95%  Weight: 140 lb 6.4 oz (63.7 kg)   137 lb 3.2 oz (62.2 kg)  Height: 5\' 2"  (1.575 m)       Intake/Output Summary (Last 24 hours) at 05/01/16 0730 Last data filed at 04/30/16 2040  Gross per 24 hour  Intake              243 ml  Output                0 ml  Net              243 ml   Filed Weights   04/30/16 0255 04/30/16 0655 05/01/16 0435  Weight: 142  lb (64.4 kg) 140 lb 6.4 oz (63.7 kg) 137 lb 3.2 oz (62.2 kg)    Physical Exam    GEN: Well nourished, well developed, in no acute distress.  HEENT: Grossly normal.  Neck: Supple, no JVD, carotid bruits, or masses. Cardiac: RRR, III/VI systolic murmur RUSB and apex, no rubs, or gallops. No clubbing, cyanosis, edema.  Radials/DP/PT 2+ and equal bilaterally.  Respiratory:  Respirations regular and unlabored, clear to auscultation bilaterally. GI: Soft, nontender, nondistended, BS + x 4. MS: no deformity or atrophy. Skin: warm and dry, no rash. Neuro:  Strength and sensation are intact. Psych: AAOx3.  Normal affect.  Labs    CBC  Recent Labs  04/30/16 0258 04/30/16 0743  WBC 11.2* 13.3*  HGB 12.7 12.0  HCT 37.1 35.8  MCV 87.7 88.7  PLT 290 274   Basic Metabolic Panel  Recent Labs  04/30/16 0258 04/30/16 0743 04/30/16 1257  NA 142 140  --   K 3.0* 5.0  --   CL 107 106  --  CO2 24 27  --   GLUCOSE 166* 117*  --   BUN 24* 24*  --   CREATININE 0.68 0.77  --   CALCIUM 8.9 9.3  --   MG  --   --  2.0   Liver Function Tests No results for input(s): AST, ALT, ALKPHOS, BILITOT, PROT, ALBUMIN in the last 72 hours. No results for input(s): LIPASE, AMYLASE in the last 72 hours. Cardiac Enzymes  Recent Labs  04/30/16 0743 04/30/16 1257 04/30/16 1845  TROPONINI 0.12* 0.41* 0.38*   BNP Invalid input(s): POCBNP D-Dimer No results for input(s): DDIMER in the last 72 hours. Hemoglobin A1C  Recent Labs  04/30/16 0743  HGBA1C 5.3   Fasting Lipid Panel No results for input(s): CHOL, HDL, LDLCALC, TRIG, CHOLHDL, LDLDIRECT in the last 72 hours. Thyroid Function Tests  Recent Labs  04/30/16 0743  TSH 1.451    Telemetry    Sinus bradycardia, 50's bpm, occasional PACS, short run of atrial tach vs Afib with PVC and junctional escape beat - Personally Reviewed  ECG    Sinus bradycardia, 58 bpm, nonspecific st/t changes - Personally Reviewed  Radiology    Dg  Thoracic Spine 2 View  Result Date: 04/30/2016 CLINICAL DATA:  Back pain since early this morning. No acute injury. EXAM: THORACIC SPINE 2 VIEWS COMPARISON:  CT scan of the chest dated 04/30/2016 FINDINGS: There is slight degenerative disc disease at T7-8 with a vacuum phenomenon anteriorly. There is no fracture or subluxation. Incidental note is made of aortic atherosclerosis. IMPRESSION: Slight degenerative disc disease at T7-8. Aortic atherosclerosis. Electronically Signed   By: Francene Boyers M.D.   On: 04/30/2016 13:34   Dg Chest Port 1 View  Result Date: 04/30/2016 CLINICAL DATA:  83 year old female with chest pain. EXAM: PORTABLE CHEST 1 VIEW COMPARISON:  None. FINDINGS: There is mild emphysematous changes of the lungs with chronic interstitial coarsening. No focal consolidation, pleural effusion, or pneumothorax. No evidence of vascular congestion or edema. The cardiac silhouette is within normal limits. There is atherosclerotic calcification of the aortic arch. There is osteopenia. No acute fracture. IMPRESSION: No active disease. Electronically Signed   By: Elgie Collard M.D.   On: 04/30/2016 03:20   Ct Angio Chest Aorta W And/or Wo Contrast  Result Date: 04/30/2016 CLINICAL DATA:  83 year old female with dental chest pain radiating to the back. EXAM: CT ANGIOGRAPHY CHEST WITH CONTRAST TECHNIQUE: Multidetector CT imaging of the chest was performed using the standard protocol during bolus administration of intravenous contrast. Multiplanar CT image reconstructions and MIPs were obtained to evaluate the vascular anatomy. CONTRAST:  100 cc Isovue 370 COMPARISON:  Chest radiograph dated 04/30/2016 FINDINGS: Cardiovascular: There is mild cardiomegaly with biatrial dilatation. Retrograde flow of contrast from the right atrium into the IVC consistent with a degree of right cardiac dysfunction. Correlation with echocardiogram recommended. There is no pericardial effusion. Moderate atherosclerotic  calcification of the thoracic aorta. No aneurysmal dilatation or evidence of dissection. The origins of the great vessels of the aortic arch appear patent. There is no CT evidence of pulmonary embolism. Mediastinum/Nodes: Bilateral hilar and mediastinal lymph adenopathy noted. Right hilar lymph node measures up to 16 mm in short axis. Subcarinal lymphadenopathy measures 16 mm short axis. Right paratracheal lymph nodes measures 19 mm in short axis. There is a small hiatal hernia. The esophagus is grossly unremarkable. Lungs/Pleura: Faint small scattered nodular densities in the left upper lobe measuring up to 3-4 mm may be chronic or likely inflammatory/infectious in etiology.  A confluent small nodular densities noted along the right major fissure (series 7, image 25), likely inflammatory/ infectious. An area of linear and streaky density in the lingula may represent atelectasis versus scarring. Pneumonia is not excluded. There is no pleural effusion or pneumothorax. Upper Abdomen: The visualized upper abdomen is unremarkable. Musculoskeletal: Mild degenerative changes of the spine. No acute fracture. Review of the MIP images confirms the above findings. IMPRESSION: 1. No CT evidence of pulmonary embolism. 2. Mild cardiomegaly with findings suggestive of right cardiac dysfunction. Correlation with echocardiogram recommended. 3. Scattered small nodular densities in the left upper lobe and along the right major fissure which may be chronic or represent an infectious/inflammatory process. Focal linear and streaky density in the lingula anteriorly likely atelectasis/scarring. Clinical correlation follow-up recommended. 4. Bilateral hilar and mediastinal adenopathy of indeterminate etiology. Although findings may be reactive, metastatic disease or lymphoma is not entirely excluded. Correlation with clinical exam and follow-up recommended. Electronically Signed   By: Elgie CollardArash  Radparvar M.D.   On: 04/30/2016 04:22     Cardiac Studies   TTE 04/30/16: Study Conclusions  - Left ventricle: The cavity size was normal. There was mild   concentric hypertrophy. Systolic function was normal. The   estimated ejection fraction was in the range of 60% to 65%.   Grossly normal wall motion with region of hypokinesis in the   basal infero-posterior region. Left ventricular diastolic   function parameters were normal. - Aortic valve: Transvalvular velocity was increased. There was   moderate stenosis. There was mild regurgitation. Peak velocity   (S): 350 cm/s. Mean gradient (S): 29 mm Hg. Peak gradient (S): 49   mm Hg. - Mitral valve: There was moderate regurgitation. - Left atrium: The atrium was mildly dilated. - Right ventricle: Systolic function was normal. - Atrial septum: No defect or patent foramen ovale was identified.   Echo contrast study showed no right-to-left atrial level shunt,   at baseline or with provocation. - Pulmonary arteries: Systolic pressure was mildly elevated. PA   peak pressure: 38 mm Hg (S).  Patient Profile     83 y.o. female with history of HTN and GERD who [facility] with back pressure and was found to have a mildly elevated troponin with a peak of 0.41, new onset Afib, and moderate AS/MR.   Assessment & Plan    1. Elevated troponin: -Patient presented with back pressure, "not pain" on 3/14 -Was found to have a mildly elevated troponin with a peak of 0.41, currently down trending -She and her family declined heparin gtt -Family prefers to evaluate via nuclear stress testing over cardiac cath at this time -Risks and benefits of both tests described to patient and family in detail -Currently symptom-free -Continue ASA -Not on beta blocker 2/2 bradycardia overnight  -Echo with normal EF and HK of basal inferoposterior region -If patient needs cardiac cath, the [facility] does not want [facility] and prefers [facility] vs [facility]  2. Back pressure: -Ischemia  work up as above -Possibly patient's anginal equivalent (angina decubitus) -If ischemia work up is negative consider MSK  3. New onset Afib: -Short episodes from 4:45 AM to 5:45 AM on 3/14 -Occasional atrial and ventricular ectopy noted on telemetry  -Check potassium this AM s/p repletion on 3/14 -Magnesium and TSH ok -Consider evaluation for sleep apnea given bradycardia noted overnight -Patient and her family are ok with being discharged on DOAC given ehr CAHDS2VASc of at least 4 (HTN, age x  2, female)  4. Leukocytosis: -Check CBC -Per IM -No obvious sign of infection  5. Hypokalemia: -Check bmet this AM  6. Hypothyroidism: -TSH ok -Continue replacement therapy   Signed, Carola Frost [facility] Pager: [phone] 05/01/2016, 7:30 AM   Attending Note Patient seen and examined, agree with detailed note above,  Patient presentation and plan discussed on rounds.   Patient reports some back pressure last night, very brief Seem to improve by rolling onto her side As above, family discussion with details as above, did not want heparin infusion  On physical exam alert, oriented, no packed distress, lungs are clear to auscultation bilaterally, heart sounds regular with 3/6 systolic ejection murmur right sternal border appreciated, abdomen soft nontender, no significant lower extremity edema, skin warm and dry  Lab work reviewed showing last troponin 0.38 She is for stress testing this morning  ----Back pressure Stress test this morning to rule out ischemia as a cause of her symptoms Denies of pain, only a pressure in her back Consider MRI of her back if stress test is normal GEN she could be done as an outpatient. I recurrence of reevaluation in the emergency room if back pain recurs.   ----Aortic valve stenosis We'll need periodic monitoring by echocardiogram on an annual basis  ----Atrial fibrillation Dilated left atrium in the setting of aortic valve  stenosis, high risk of recurrent arrhythmia Would start eliquis twice a day. Weight is near the cut off of 60 kg for lower dose of eliquis.  Potentially would start on 5 mg twice a day with close observation as an outpatient CAHDS2VASc of at least 4   Greater than 50% was spent in counseling and coordination of care with patient Total encounter time 25 minutes or more   Signed: Dossie Arbour  M.D., Ph.D. [facility]

## 2016-05-01 NOTE — Plan of Care (Signed)
Problem: Cardiac: Goal: Ability to achieve and maintain adequate cardiovascular perfusion will improve Outcome: Progressing Discussed strategies for living with low heart rate

## 2016-05-02 ENCOUNTER — Telehealth: Payer: Self-pay | Admitting: Cardiovascular Disease

## 2016-05-02 NOTE — Telephone Encounter (Signed)
l mom to schedule TCM/PH. Pt saw Dr. Mariah MillingGollan in the Hospital.

## 2016-05-02 NOTE — Discharge Summary (Signed)
[facility] [place] [facility]   PATIENT NAME: Christine James    MR#:  [id number]  DATE OF BIRTH:  [birth year]  DATE OF ADMISSION:  04/30/2016 ADMITTING PHYSICIAN: Arnaldo NatalMichael S Diamond, MD  DATE OF DISCHARGE: 05/01/2016  3:31 PM  PRIMARY CARE PHYSICIAN: No PCP Per Patient   ADMISSION DIAGNOSIS:  Sinus bradycardia [R00.1] Chest pain, unspecified type [R07.9]  DISCHARGE DIAGNOSIS:  Principal Problem:   Back pain Active Problems:   Acid reflux   Hypokalemia   Leukocytosis   Hypothyroid   Essential hypertension   New onset atrial fibrillation (HCC)   Chest pain   Elevated troponin   SECONDARY DIAGNOSIS:   Past Medical History:  Diagnosis Date  . Hypertension   . PAF (paroxysmal atrial fibrillation) (HCC)    a. diagnosed 04/30/16; b. CHADS2VASc 4 (HTN, age x 2, female)  . Reflux esophagitis      ADMITTING HISTORY  Chief Complaint: Chest pain HPI: The patient with past medical history of hypertension and GERD presents to emergency department complaining of chest pain. More specifically, the patient admits to pain in her upper back that seems to radiate through her chest (although notably, she states that she does not have anterior chest pain). She describes the pain as "a Mack truck rolling over her chest". He began at rest and lasted for hours. She denies diaphoresis but admits to shortness of breath and nausea. She denies wanting. This is not the first time she has had shortness of breath but in the past attributed to deconditioning whether it be walking up steps or carrying something heavy. CTA of the chest was obtained to rule out aortic dissection. Initial troponin was negative but due to the patient's severe symptoms emergency department staff called the hospitalist service for admission.  HOSPITAL COURSE:   * Atypical chest pain. Patient had mild elevation troponin. Had Myoview stress test done by cardiology. This showed no reversible ischemia. Small  area of concern was thought to be gastric attenuation. Troponin trended down on repeat blood work. No further chest pain in the hospital. Her chest pain could have been likely due to atrial fibrillation.  *  Atrial fibrillation. Patient is rate controlled. No new medication started for this. She will be started on Eliquis for anticoagulation.  * Hypertension. Continue home medications.  Patient is being discharged home in stable condition. She prefers to follow-up with Dr. Lady GaryFath of cardiology.  CONSULTS OBTAINED:  Treatment Team:  Antonieta Ibaimothy J Gollan, MD  DRUG ALLERGIES:   Allergies  Allergen Reactions  . Codeine Shortness Of Breath  . Sulfa Antibiotics Shortness Of Breath    DISCHARGE MEDICATIONS:   Discharge Medication List as of 05/01/2016  2:28 PM    START taking these medications   Details  apixaban (ELIQUIS) 5 MG TABS tablet Take 1 tablet (5 mg total) by mouth 2 (two) times daily., Starting Thu 05/01/2016, Print      CONTINUE these medications which have NOT CHANGED   Details  amLODipine (NORVASC) 10 MG tablet Take 10 mg by mouth daily., Historical Med    aspirin 81 MG chewable tablet Chew 81 mg by mouth daily., Historical Med    levothyroxine (SYNTHROID, LEVOTHROID) 88 MCG tablet Take 88 mcg by mouth daily before breakfast., Historical Med    lisinopril-hydrochlorothiazide (PRINZIDE,ZESTORETIC) 20-25 MG tablet Take 1 tablet by mouth daily., Historical Med        Today   VITAL SIGNS:  Blood pressure (!) 142/45, pulse (!) 53, temperature 97.7  F (36.5 C), temperature source Oral, resp. rate 16, height 5\' 2"  (1.575 m), weight 62.2 kg (137 lb 3.2 oz), SpO2 92 %.  I/O:  No intake or output data in the 24 hours ending 05/02/16 1513  PHYSICAL EXAMINATION:  Physical Exam  GENERAL:  83 y.o.-year-old patient lying in the bed with no acute distress.  LUNGS: Normal breath sounds bilaterally, no wheezing, rales,rhonchi or crepitation. No use of accessory muscles of  respiration.  CARDIOVASCULAR: S1, S2 normal. No murmurs, rubs, or gallops.  ABDOMEN: Soft, non-tender, non-distended. Bowel sounds present. No organomegaly or mass.  NEUROLOGIC: Moves all 4 extremities. PSYCHIATRIC: The patient is alert and oriented x 3.  SKIN: No obvious rash, lesion, or ulcer.   DATA REVIEW:   CBC  Recent Labs Lab 05/01/16 0818  WBC 8.1  HGB 12.5  HCT 36.7  PLT 263    Chemistries   Recent Labs Lab 04/30/16 1257 05/01/16 0818  NA  --  139  K  --  4.1  CL  --  104  CO2  --  27  GLUCOSE  --  113*  BUN  --  23*  CREATININE  --  0.85  CALCIUM  --  8.8*  MG 2.0  --     Cardiac Enzymes  Recent Labs Lab 04/30/16 1845  TROPONINI 0.38*    Microbiology Results  No results found for this or any previous visit.  RADIOLOGY:  Nm Myocar Multi W/spect W/wall Motion / Ef  Result Date: 05/01/2016 Pharmacological myocardial perfusion imaging study with no significant  Ischemia Small region of fixed perfusion defect in the basal inferior wall of moderate severity, unable to exclude previous MI versus attenuation artifact There is GI uptake artifact in this region Normal wall motion, EF estimated at 60% No EKG changes concerning for ischemia at peak stress or in recovery. Resting EKG with nonspecific ST and T wave abnormality in lead 3 and aVF Low risk scan Signed, Dossie Arbour, MD, Ph.D [facility]    Follow up with PCP in 1 week.  Management plans discussed with the patient, family and they are in agreement.  CODE STATUS:  Code Status History    Date Active Date Inactive Code Status Order ID Comments User Context   04/30/2016  6:55 AM 05/01/2016  6:37 PM Full Code [id number]  Arnaldo Natal, MD Inpatient    Advance Directive Documentation     Most Recent Value  Type of Advance Directive  Living will  Pre-existing out of facility DNR order (yellow form or pink MOST form)  -  "MOST" Form in Place?  -      TOTAL TIME TAKING CARE OF THIS PATIENT ON  DAY OF DISCHARGE: more than 30 minutes.   Milagros Loll R M.D on 05/02/2016 at 3:13 PM  Between 7am to 6pm - Pager - [phone]  After 6pm go to [email or web address] - password [email or web address] [facility]  [facility] [place] [facility]  Office  [phone]  CC: Primary care physician; No PCP Per Patient  Note: This dictation was prepared with Dragon dictation along with smaller phrase technology. Any transcriptional errors that result from this process are unintentional.

## 2016-06-17 DEATH — deceased

## 2018-07-07 IMAGING — CT CT ANGIO CHEST
3 of 7 series · 18 of 46 positions shown · IV contrast (APPLIED)
Comparison: Chest radiograph dated 04/30/2016

CLINICAL DATA: 88-year-old female with dental chest pain radiating
to the back.

EXAM:
CT ANGIOGRAPHY CHEST WITH CONTRAST
TECHNIQUE: Multidetector CT imaging of the chest was performed using the
standard protocol during bolus administration of intravenous
contrast. Multiplanar CT image reconstructions and MIPs were
obtained to evaluate the vascular anatomy.
CONTRAST:  100 cc Isovue 370

[Series 6: axial arterial · axial · arterial · 0.64mm/px · z∈[-770,-512]mm · 13 of 102 slices shown]
[im 8/102  lung]
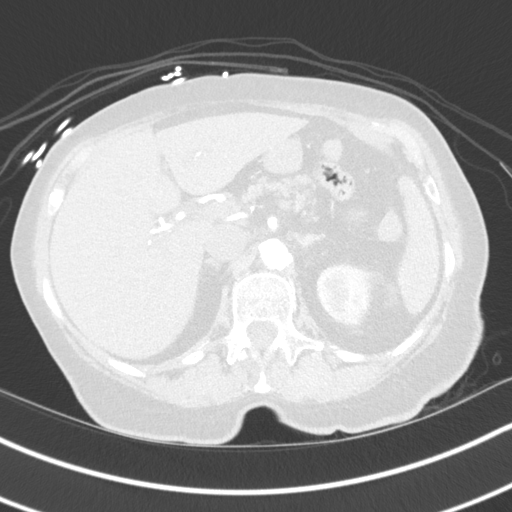
[im 15/102  soft-tissue]
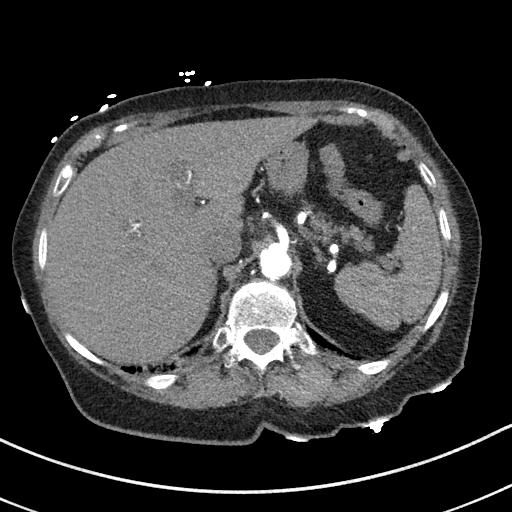
[im 22/102  lung]
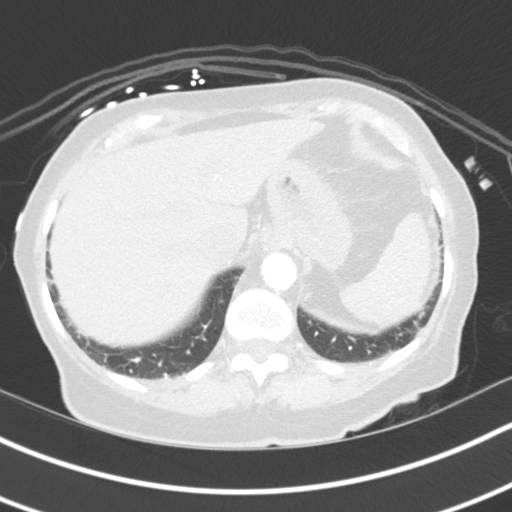
[im 29/102  soft-tissue]
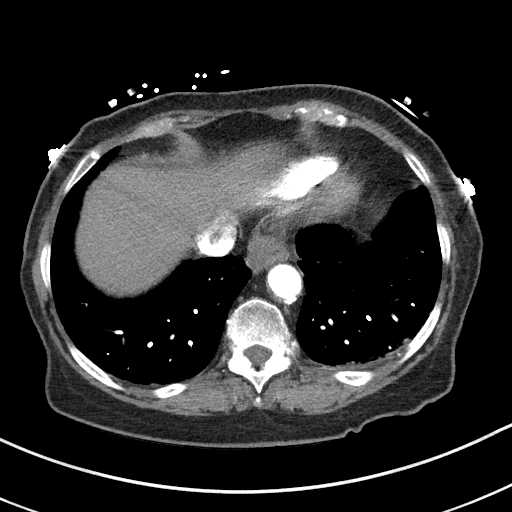
[im 37/102  lung]
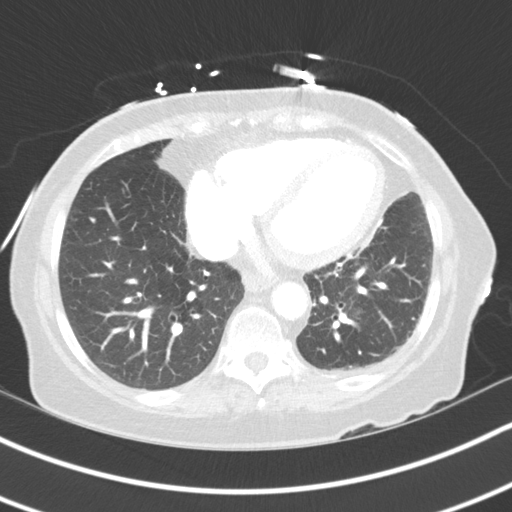
[im 44/102  soft-tissue]
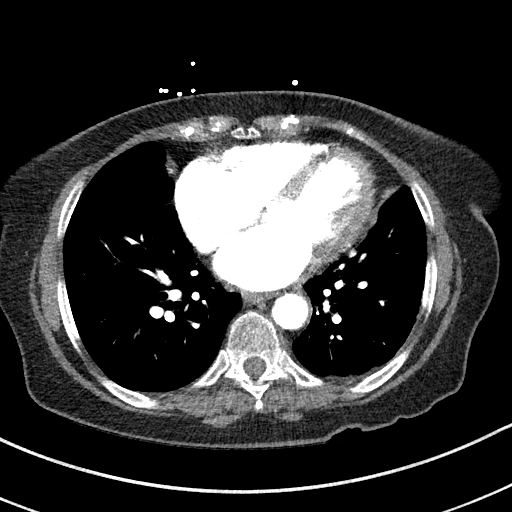
[im 51/102  lung]
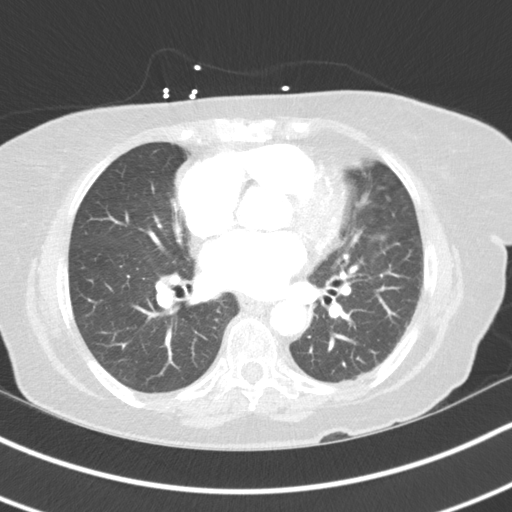
[im 58/102  soft-tissue]
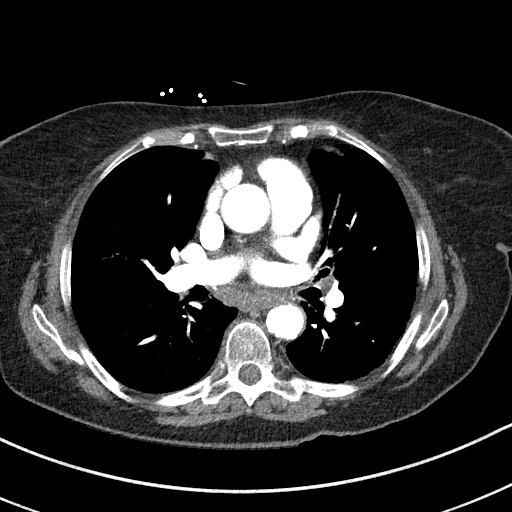
[im 65/102  lung]
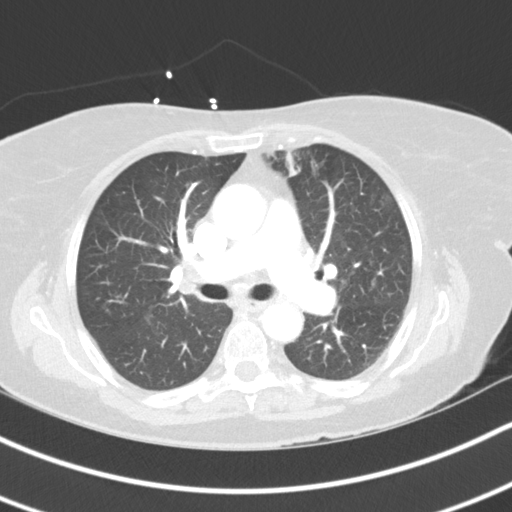
[im 73/102  soft-tissue]
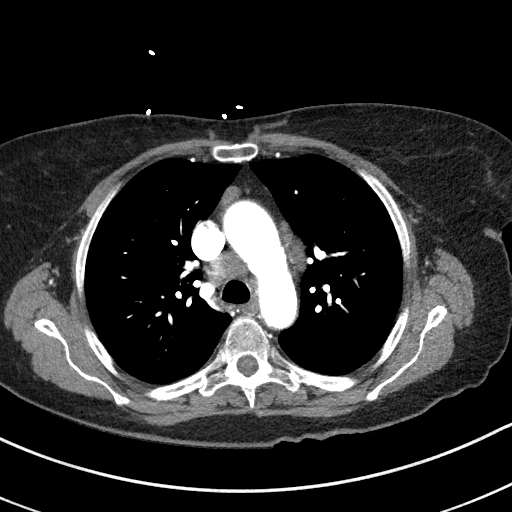
[im 80/102  lung]
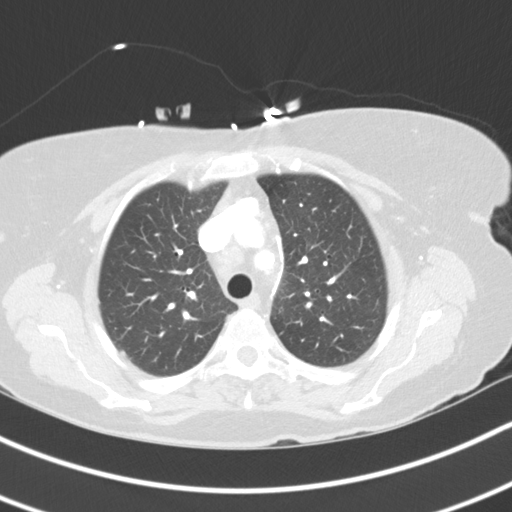
[im 87/102  soft-tissue]
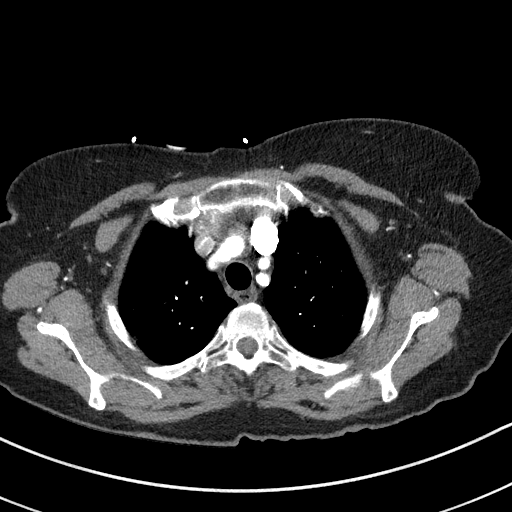
[im 94/102  lung]
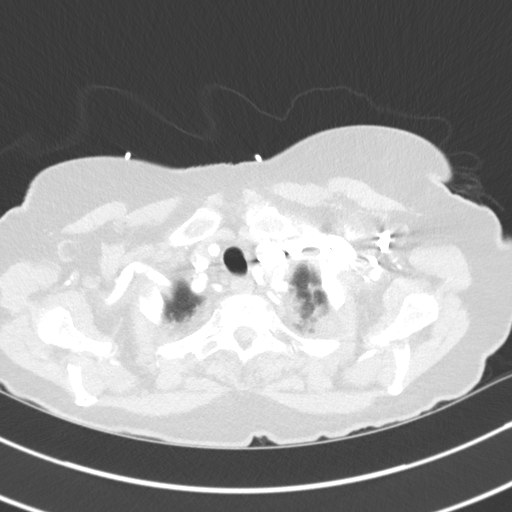

[Series 7: lung · axial · 0.64mm/px · z∈[-753,-713]mm · 2 of 61 slices shown]
[im 8/61  soft-tissue]
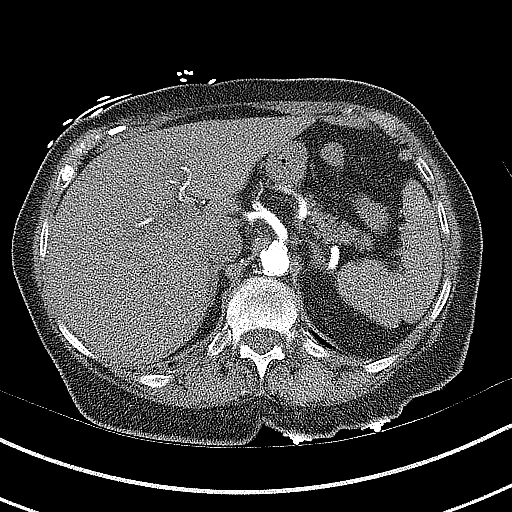
[im 16/61  soft-tissue]
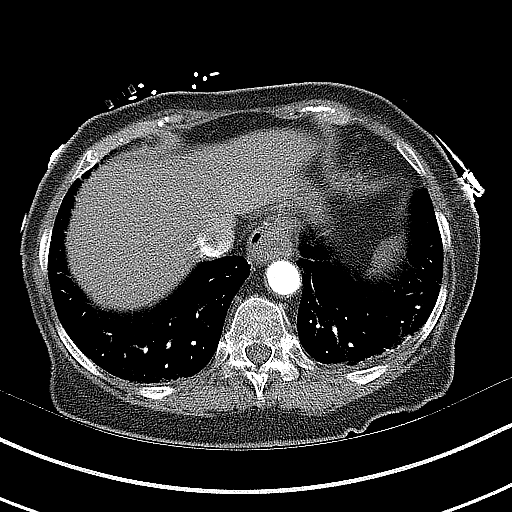

[Series 8: coronals · coronal · 0.60mm/px · 3 of 114 slices shown]
[im 29/114  soft-tissue]
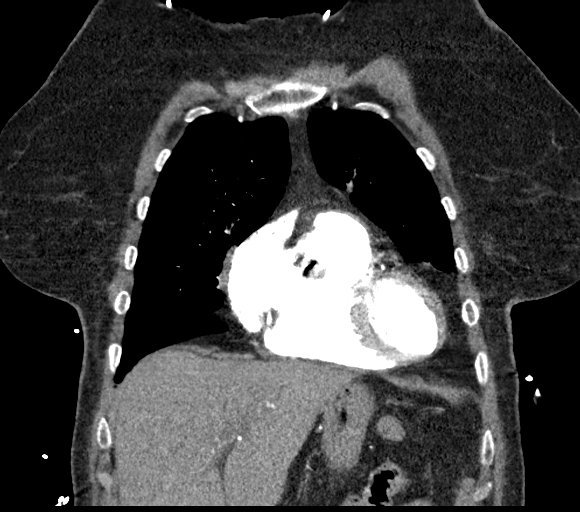
[im 57/114  soft-tissue]
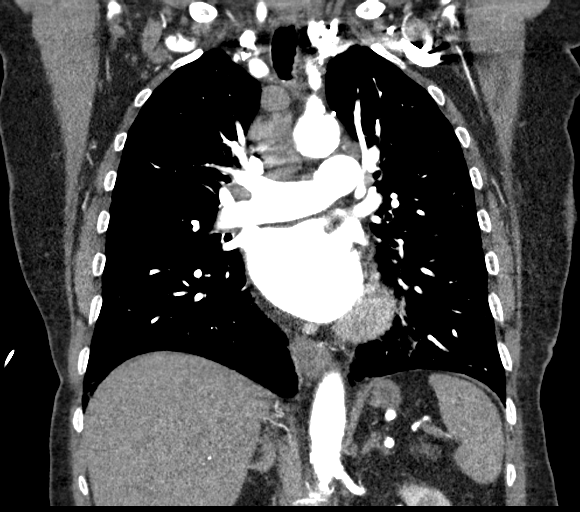
[im 85/114  soft-tissue]
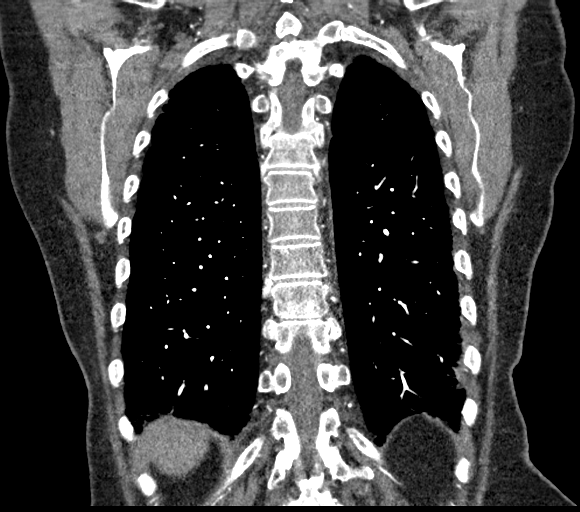

[18 of 46 positions shown; findings below may reference images not displayed]

FINDINGS: Cardiovascular: There is mild cardiomegaly with biatrial dilatation.
Retrograde flow of contrast from the right atrium into the IVC
consistent with a degree of right cardiac dysfunction. Correlation
with echocardiogram recommended. There is no pericardial effusion.
Moderate atherosclerotic calcification of the thoracic aorta. No
aneurysmal dilatation or evidence of dissection. The origins of the
great vessels of the aortic arch appear patent. There is no CT
evidence of pulmonary embolism.

Mediastinum/Nodes: Bilateral hilar and mediastinal lymph adenopathy
noted. Right hilar lymph node measures up to 16 mm in short axis.
Subcarinal lymphadenopathy measures 16 mm short axis. Right
paratracheal lymph nodes measures 19 mm in short axis. There is a
small hiatal hernia. The esophagus is grossly unremarkable.

Lungs/Pleura: Faint small scattered nodular densities in the left
upper lobe measuring up to 3-4 mm may be chronic or likely
inflammatory/infectious in etiology. A confluent small nodular
densities noted along the right major fissure (series 7, image 25),
likely inflammatory/ infectious. An area of linear and streaky
density in the lingula may represent atelectasis versus scarring.
Pneumonia is not excluded. There is no pleural effusion or
pneumothorax.

Upper Abdomen: The visualized upper abdomen is unremarkable.

Musculoskeletal: Mild degenerative changes of the spine. No acute
fracture.

Review of the MIP images confirms the above findings.
IMPRESSION: 1. No CT evidence of pulmonary embolism.
2. Mild cardiomegaly with findings suggestive of right cardiac
dysfunction. Correlation with echocardiogram recommended.
3. Scattered small nodular densities in the left upper lobe and
along the right major fissure which may be chronic or represent an
infectious/inflammatory process. Focal linear and streaky density in
the lingula anteriorly likely atelectasis/scarring. Clinical
correlation follow-up recommended.
4. Bilateral hilar and mediastinal adenopathy of indeterminate
etiology. Although findings may be reactive, metastatic disease or
lymphoma is not entirely excluded. Correlation with clinical exam
and follow-up recommended.
